# Patient Record
Sex: Male | Born: 2000 | Race: Black or African American | Hispanic: No | Marital: Single | Smoking: Never smoker
Health system: Southern US, Community
[De-identification: ages and names within clinical notes are randomized; demographics above are authoritative.]

## PROBLEM LIST (undated history)

## (undated) DIAGNOSIS — R011 Cardiac murmur, unspecified: Secondary | ICD-10-CM

---
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
## 2022-08-05 ENCOUNTER — Emergency Department (HOSPITAL_COMMUNITY): Payer: Medicaid Other

## 2022-08-05 ENCOUNTER — Encounter (HOSPITAL_COMMUNITY): Payer: Self-pay

## 2022-08-05 ENCOUNTER — Emergency Department (HOSPITAL_COMMUNITY)
Admission: EM | Admit: 2022-08-05 | Discharge: 2022-08-05 | Discharge status: Against Medical Advice | Payer: Medicaid Other | Attending: Emergency Medicine | Admitting: Emergency Medicine

## 2022-08-05 ENCOUNTER — Other Ambulatory Visit: Payer: Self-pay

## 2022-08-05 DIAGNOSIS — R079 Chest pain, unspecified: Secondary | ICD-10-CM | POA: Diagnosis present

## 2022-08-05 DIAGNOSIS — Z5321 Procedure and treatment not carried out due to patient leaving prior to being seen by health care provider: Secondary | ICD-10-CM | POA: Diagnosis not present

## 2022-08-05 DIAGNOSIS — R002 Palpitations: Secondary | ICD-10-CM | POA: Diagnosis not present

## 2022-08-05 HISTORY — DX: Cardiac murmur, unspecified: R01.1

## 2022-08-05 LAB — BASIC METABOLIC PANEL
Anion gap: 6 (ref 5–15)
BUN: 10 mg/dL (ref 6–20)
CO2: 28 mmol/L (ref 22–32)
Calcium: 9.8 mg/dL (ref 8.9–10.3)
Chloride: 107 mmol/L (ref 98–111)
Creatinine, Ser: 1.17 mg/dL (ref 0.61–1.24)
GFR, Estimated: >60 mL/min (ref >60)
Glucose, Bld: 105 mg/dL — ABNORMAL HIGH (ref 70–99)
Potassium: 4 mmol/L (ref 3.5–5.1)
Sodium: 141 mmol/L (ref 135–145)

## 2022-08-05 LAB — CBC
HCT: 45.6 % (ref 39.0–52.0)
Hemoglobin: 15.3 g/dL (ref 13.0–17.0)
MCH: 28.8 pg (ref 26.0–34.0)
MCHC: 33.6 g/dL (ref 30.0–36.0)
MCV: 85.7 fL (ref 80.0–100.0)
Platelets: 213 10*3/uL (ref 150–400)
RBC: 5.32 MIL/uL (ref 4.22–5.81)
RDW: 12.3 % (ref 11.5–15.5)
WBC: 5.2 10*3/uL (ref 4.0–10.5)
nRBC: 0 % (ref 0.0–0.2)

## 2022-08-05 LAB — TROPONIN I (HIGH SENSITIVITY): Troponin I (High Sensitivity): 3 ng/L (ref <18)

## 2022-08-05 NOTE — ED Triage Notes (Addendum)
Pt reports with chest pain and palpitations since yesterday. Pt states that it has been off and on all day.

## 2023-04-04 ENCOUNTER — Emergency Department: Payer: Medicaid Other

## 2023-04-04 ENCOUNTER — Emergency Department
Admission: EM | Admit: 2023-04-04 | Discharge: 2023-04-04 | Disposition: A | Discharge status: Home / Self Care | Payer: Medicaid Other | Attending: Emergency Medicine | Admitting: Emergency Medicine

## 2023-04-04 ENCOUNTER — Other Ambulatory Visit: Payer: Self-pay

## 2023-04-04 DIAGNOSIS — M25571 Pain in right ankle and joints of right foot: Secondary | ICD-10-CM | POA: Diagnosis present

## 2023-04-04 DIAGNOSIS — S86011A Strain of right Achilles tendon, initial encounter: Secondary | ICD-10-CM | POA: Insufficient documentation

## 2023-04-04 DIAGNOSIS — Y9367 Activity, basketball: Secondary | ICD-10-CM | POA: Insufficient documentation

## 2023-04-04 DIAGNOSIS — W228XXA Striking against or struck by other objects, initial encounter: Secondary | ICD-10-CM | POA: Diagnosis not present

## 2023-04-04 MED ORDER — MELOXICAM 15 MG PO TABS: 15.0000 mg | ORAL_TABLET | Freq: Every day | ORAL | 0 refills | Status: AC

## 2023-04-04 MED ORDER — MELOXICAM 7.5 MG PO TABS
15.0000 mg | ORAL_TABLET | Freq: Once | ORAL | Status: AC
  Administered 2023-04-04: 15 mg via ORAL
  Filled 2023-04-04: qty 2

## 2023-04-04 NOTE — ED Triage Notes (Signed)
Pt to ED from home for ankle injury. Pt was playing basketball, felt pop in the back side of his ankle. Pt is unable bear weight on his ankle. Pt is CAOx4, in no acute distress.

## 2023-04-04 NOTE — ED Provider Notes (Signed)
[facility] Provider Note  Patient Contact: 8:27 PM (approximate)   History   Foot Injury (RIGHT)   HPI  Steven Salas is a 22 y.o. male who presents emergency department complaining of right posterior ankle pain.  Patient was playing basketball and felt like something hit him in the back and ankle.  Since then he has had pain and difficulty dorsiflexion his foot.  No deformity noted.  Pain is minimal at this time.  No other injury or complaint.  Patient is never injured his Achilles in the past.     Physical Exam   Triage Vital Signs: ED Triage Vitals  Enc Vitals Group     BP 04/04/23 1931 132/72     Pulse Rate 04/04/23 1931 88     Resp 04/04/23 1931 16     Temp 04/04/23 1931 97.6 F (36.4 C)     Temp Source 04/04/23 1931 Oral     SpO2 04/04/23 1931 98 %     Weight 04/04/23 1932 215 lb (97.5 kg)     Height 04/04/23 1932 5\' 10"  (1.778 m)     Head Circumference --      Peak Flow --      Pain Score 04/04/23 1931 2     Pain Loc --      Pain Edu? --      Excl. in GC? --     Most recent vital signs: Vitals:   04/04/23 1931  BP: 132/72  Pulse: 88  Resp: 16  Temp: 97.6 F (36.4 C)  SpO2: 98%     General: Alert and in no acute distress.  Cardiovascular:  Good peripheral perfusion Respiratory: Normal respiratory effort without tachypnea or retractions. Lungs CTAB.  Musculoskeletal: Full range of motion to all extremities.  Visualization of the ankle reveals no open wounds.  No edema, ecchymosis.  Palpation of the ankle reveals intact Achilles tendon.  Patient is able to dorsiflex and plantarflex the dorsiflexion does cause pain along the Achilles.  Pulses sensation intact. Neurologic:  No gross focal neurologic deficits are appreciated.  Skin:   No rash noted Other:   ED Results / Procedures / Treatments   Labs (all labs ordered are listed, but only abnormal results are displayed) Labs Reviewed - No data to  display   EKG     RADIOLOGY  I personally viewed, evaluated, and interpreted these images as part of my medical decision making, as well as reviewing the written report by the radiologist.  ED Provider Interpretation: No osseous trauma to the ankle.  Achilles tendon appears to be intact on x-ray.  DG Ankle Complete Right  Result Date: 04/04/2023 CLINICAL DATA:  Status post trauma. EXAM: RIGHT ANKLE - COMPLETE 3+ VIEW COMPARISON:  None Available. FINDINGS: There is no evidence of fracture, dislocation, or joint effusion. There is no evidence of arthropathy or other focal bone abnormality. Soft tissue structures are unremarkable. IMPRESSION: No acute osseous abnormality. Electronically Signed   By: Aram Candela M.D.   On: 04/04/2023 20:03    PROCEDURES:  Critical Care performed: No  Procedures   MEDICATIONS ORDERED IN ED: Medications  meloxicam (MOBIC) tablet 15 mg (has no administration in time range)     IMPRESSION / MDM / ASSESSMENT AND PLAN / ED COURSE  I reviewed the triage vital signs and the nursing notes.  Differential diagnosis includes, but is not limited to, Achilles tendon rupture, ankle sprain, Achilles tendon strain, tendinitis, fracture, dislocation   Patient's presentation is most consistent with acute presentation with potential threat to life or bodily function.   Patient's diagnosis is consistent with achilles strain. Patient was playing basektball and felt pain in the achilles region. It was causing ongoing symptoms.  Palpation did not reveal any deficits concerning for acute Achilles rupture.  X-ray was obtained no fracture, dislocation and Achilles tendon was visualized on x-ray.  This time I feel that this is likely a strain of his Achilles, will place in a boot and prescribed anti-inflammatories for the patient..  Patient is given ED precautions to return to the ED for any worsening or new symptoms.     FINAL  CLINICAL IMPRESSION(S) / ED DIAGNOSES   Final diagnoses:  Strain of right Achilles tendon, initial encounter     Rx / DC Orders   ED Discharge Orders          Ordered    meloxicam (MOBIC) 15 MG tablet  Daily        04/04/23 2036             Note:  This document was prepared using Dragon voice recognition software and may include unintentional dictation errors.   Lanette Hampshire 04/04/23 2036    Chesley Noon, MD 04/07/23 [phone]

## 2023-04-11 ENCOUNTER — Other Ambulatory Visit: Payer: Self-pay | Admitting: Nurse Practitioner

## 2023-04-11 DIAGNOSIS — S86011A Strain of right Achilles tendon, initial encounter: Secondary | ICD-10-CM

## 2023-04-30 ENCOUNTER — Ambulatory Visit
Admission: RE | Admit: 2023-04-30 | Discharge: 2023-04-30 | Disposition: A | Discharge status: Home / Self Care | Payer: Medicaid Other | Source: Ambulatory Visit | Attending: Nurse Practitioner | Admitting: Nurse Practitioner

## 2023-04-30 DIAGNOSIS — S86011A Strain of right Achilles tendon, initial encounter: Secondary | ICD-10-CM | POA: Insufficient documentation

## 2023-08-26 ENCOUNTER — Ambulatory Visit: Payer: Medicaid Other | Attending: Nurse Practitioner

## 2023-08-26 ENCOUNTER — Other Ambulatory Visit: Payer: Self-pay

## 2023-08-26 DIAGNOSIS — R2689 Other abnormalities of gait and mobility: Secondary | ICD-10-CM

## 2023-08-26 DIAGNOSIS — M7661 Achilles tendinitis, right leg: Secondary | ICD-10-CM | POA: Insufficient documentation

## 2023-08-26 DIAGNOSIS — M25671 Stiffness of right ankle, not elsewhere classified: Secondary | ICD-10-CM | POA: Diagnosis present

## 2023-08-26 DIAGNOSIS — R600 Localized edema: Secondary | ICD-10-CM | POA: Diagnosis not present

## 2023-08-26 DIAGNOSIS — M6281 Muscle weakness (generalized): Secondary | ICD-10-CM

## 2023-08-26 NOTE — Therapy (Addendum)
 OUTPATIENT PHYSICAL THERAPY EVALUATION/DISCHARGE  PHYSICAL THERAPY DISCHARGE SUMMARY  Visits from Start of Care: 1  Current functional level related to goals / functional outcomes: See goals/objective   Remaining deficits: Unable to assess   Education / Equipment: HEP   Patient agrees to discharge. Patient goals were unable to assess. Patient is being discharged due to not returning since the last visit.    Patient Name: Steven Salas MRN: [id number] DOB:August 15, 2001, 22 y.o., male Today's Date: 08/27/2023  END OF SESSION:  PT End of Session - 08/26/23 1525     Visit Number 1    Number of Visits 17    Date for PT Re-Evaluation 10/21/23    Authorization Type [employer] [place] [employer]    PT Start Time 1445    PT Stop Time 1520    PT Time Calculation (min) 35 min    Activity Tolerance Patient tolerated treatment well    Behavior During Therapy WFL for tasks assessed/performed             Past Medical History:  Diagnosis Date   Heart murmur    History reviewed. No pertinent surgical history. There are no problems to display for this patient.   PCP: No PCP  REFERRING PROVIDER: Levonne Lapping, NP  REFERRING DIAG: M76.61 (ICD-10-CM) - Achilles tendinitis, right leg   Rationale for Evaluation and Treatment: Rehabilitation  THERAPY DIAG:  Stiffness of right ankle, not elsewhere classified  Muscle weakness (generalized)  Other abnormalities of gait and mobility  Localized edema  ONSET DATE: 04/04/2023  SUBJECTIVE:                                                                                                                                                                                           SUBJECTIVE STATEMENT: Pt presents to PT with reports of R ankle pain and stiffness after injury while playing basketball on 04/04/2023 that resulted in partially torn achilles. Has been in a boot for a few months and notes that pain has decreased, however his R ankle is very  stiff and weak. Podiatry recommended weaning from boot into a regular shoe with heel wedge. Also promotes numbness in lateral R ankle.  PERTINENT HISTORY:  None  PAIN:  Are you having pain?  Yes: NPRS scale: 0/10 Worst: 2/10 Pain location: R achilles Pain description: sharp, numb Aggravating factors: athletic activities Relieving factors: rest  PRECAUTIONS: None  RED FLAGS: None   WEIGHT BEARING RESTRICTIONS: No  FALLS:  Has patient fallen in last 6 months? No  LIVING ENVIRONMENT: Lives with: lives with their family Lives in: House/apartment  OCCUPATION: Arts development officer at [employer]  PLOF: Independent  PATIENT GOALS: improve R ankle ROM, get back to higher level athletic activity  OBJECTIVE:  Note: Objective measures were completed at Evaluation unless otherwise noted.  DIAGNOSTIC FINDINGS:  See imaging for MRI to R achilles  PATIENT SURVEYS:  FOTO: 47% function; 68% predicted  COGNITION: Overall cognitive status: Within functional limits for tasks assessed    SENSATION: Light touch: Impaired - lateral R ankle  MUSCLE LENGTH: Hamstrings: Right WFL Left WFL  Thomas test: Right WFL; Left WFL  POSTURE: No Significant postural limitations  PALPATION: TTP to distal R calf, R achilles  LUMBAR ROM:   AROM eval  Flexion   Extension   Right lateral flexion   Left lateral flexion   Right rotation   Left rotation    (Blank rows = not tested)  LOWER EXTREMITY ROM:   Active  Right eval Left eval  Hip flexion    Hip extension    Hip abduction    Hip adduction    Hip internal rotation    Hip external rotation    Knee flexion    Knee extension    Ankle dorsiflexion 0 A  (7 PROM) 10  (17 P)  Ankle plantarflexion    Ankle inversion    Ankle eversion     (Blank rows = not tested)  LOWER EXTREMITY MMT:    MMT Right eval Left eval  Hip flexion    Hip extension    Hip abduction    Hip adduction    Hip internal rotation    Hip external  rotation    Knee flexion    Knee extension    Ankle dorsiflexion    Ankle plantarflexion    Ankle inversion    Ankle eversion     (Blank rows = not tested)  FUNCTIONAL TESTS:  Functional Squat: Quad dominant, no deficits noted  GAIT: Distance walked: 65ft Assistive device utilized: None Level of assistance: Complete Independence Comments: decreased heel strike R  TREATMENT: [facility] Adult PT Treatment:                                                DATE: 08/26/2023 Therapeutic Exercise: Calf stretch with towel x 30" Seated heel raise x 15 R 15# Standing gastroc stretch x 30" R Standing soleus stretch x 30" R  PATIENT EDUCATION:  Education details: eval findings, FOTO, HEP, POC Person educated: Patient Education method: Explanation, Demonstration, and Handouts Education comprehension: verbalized understanding and returned demonstration  HOME EXERCISE PROGRAM: Access Code: [email or web address] URL: [email or web address] Date: 08/26/2023 Prepared by: Edwinna Areola  Exercises - Long Sitting Calf Stretch with Strap  - 1 x daily - 7 x weekly - 2-3 reps - 30 sec hold - Seated Heel Raise  - 1 x daily - 7 x weekly - 3 sets - 15 reps - 15lb hold - Gastroc Stretch on Wall  - 1 x daily - 7 x weekly - 2-3 reps - 30 sec hold - Soleus Stretch on Wall  - 1 x daily - 7 x weekly - 2-3 reps - 30 sec hold  ASSESSMENT:  CLINICAL IMPRESSION: Patient is a 22 y.o. M who was seen today for physical therapy evaluation and treatment for R achilles stair with intrasubstance tear following basketball injury on 04/04/2023. Physical findings are consistent with injury and recovery timeline as pt demonstrates decrease in R  ankle DF and strength. FOTO score shows subjective decrease in functional ability below PLOF. Pt would benefit from skilled PT services working on improving strength and range of R ankle post achilles tear.   OBJECTIVE IMPAIRMENTS: decreased activity tolerance, decreased balance,  decreased mobility, difficulty walking, decreased ROM, decreased strength, and pain  ACTIVITY LIMITATIONS: sitting, standing, squatting, stairs, transfers, and locomotion level  PARTICIPATION LIMITATIONS: driving, shopping, community activity, occupation, and yard work  PERSONAL FACTORS: Fitness and Time since onset of injury/illness/exacerbation are also affecting patient's functional outcome.   REHAB POTENTIAL: Excellent  CLINICAL DECISION MAKING: Stable/uncomplicated  EVALUATION COMPLEXITY: Low   GOALS: Goals reviewed with patient? No  SHORT TERM GOALS: Target date: 09/16/2023   Pt will be compliant and knowledgeable with initial HEP for improved comfort and carryover Baseline: initial HEP given  Goal status: INITIAL  LONG TERM GOALS: Target date: 10/21/2023   Pt will improve FOTO function score to no less than 68% as proxy for functional improvement Baseline: 47% function Goal status: INITIAL   2.  Pt will self report right heel pain no greater than 0/10 for improved comfort and functional ability Baseline: 2/10 at worst Goal status: INITIAL   3.  Pt will improve R ankle DF to no less than 10 degrees actively for improved gait and mechanics with squatting and walking Baseline: see ROM chart Goal status: INITIAL  4.  Pt will be able to perform 15 single leg heel raise on R LE for demonstration of improved calf strength and increase in functional ability Baseline: 0 Goal status: INITIAL   PLAN:  PT FREQUENCY: 2x/week  PT DURATION: 8 weeks  PLANNED INTERVENTIONS: [id number]- PT Re-evaluation, 97110-Therapeutic exercises, 97530- Therapeutic activity, [id number]- Neuromuscular re-education, 97535- Self Care, 53664- Manual therapy, [id number]- Gait training, [id number]- Aquatic Therapy, 97014- Electrical stimulation (unattended), [id number]- Electrical stimulation (manual), 97016- Vasopneumatic device, Cryotherapy, and Moist heat.  PLAN FOR NEXT SESSION: assess HEP response, progressive LE  strengthening with emphasis on calf, improving R ankle DF  For all possible CPT codes, reference the Planned Interventions line above.     Check all conditions that are expected to impact treatment: {Conditions expected to impact treatment:Musculoskeletal disorders   If treatment provided at initial evaluation, no treatment charged due to lack of authorization.       Eloy End, PT 08/27/2023, 8:33 AM

## 2023-09-01 NOTE — Addendum Note (Signed)
Addended by: Eloy End on: 09/01/2023 08:51 AM   Modules accepted: Orders

## 2023-09-04 ENCOUNTER — Encounter: Payer: Self-pay | Admitting: Physical Therapy

## 2023-09-04 ENCOUNTER — Ambulatory Visit: Payer: Medicaid Other | Admitting: Physical Therapy

## 2023-09-11 ENCOUNTER — Ambulatory Visit: Payer: Medicaid Other

## 2023-09-11 ENCOUNTER — Telehealth: Payer: Self-pay

## 2023-09-11 NOTE — Telephone Encounter (Signed)
PT called and spoke with patient who forgot this appointment. Reviewed attendance policy and because this is his second no show he can only schedule one visit at a time. Pt confirmed understanding and next appointment.   Steven Salas   09/11/23 3:07 PM

## 2023-09-15 ENCOUNTER — Ambulatory Visit: Payer: Medicaid Other | Attending: Nurse Practitioner

## 2023-09-15 ENCOUNTER — Telehealth: Payer: Self-pay

## 2023-09-15 NOTE — Telephone Encounter (Signed)
PT called but was unable to leave voicemail. This is his 3rd straight no show. He will be discharged at this time and need a new referral to come back per attendance policy.   Eloy End   09/15/23 3:23 PM

## 2024-04-15 NOTE — Progress Notes (Signed)
 New Patient Visit   Chief Complaint: Chief Complaint  Patient presents with  . Palpitations  . Follow-up  . Results   Date of Service: 04/15/2024 Date of Birth: 07/07/2001 PCP: Provider No address on file  History of Present Illness:  History of Present Illness Steven Salas is a 23 year old male with a history of palpitations and atypical chest pain who presents for a cardiovascular follow-up.  He recently underwent an echocardiogram, which showed normal heart function and no valve abnormalities. This was reassuring given his history of palpitations, atypical chest pain, and a previously noted heart murmur.  His blood pressure was noted to be elevated during the visit. He does not routinely monitor his blood pressure at home but recalls that readings at other doctor's offices are usually normal. He speculates that the recent meal he had before the appointment might have influenced the reading.   Past Medical and Surgical History  Past Medical History No past medical history on file.  Past Surgical History He has no past surgical history on file.   Medications and Allergies  Current Medications  Current Outpatient Medications on File Prior to Visit  Medication Sig Dispense Refill  . cetirizine (ZYRTEC) 10 MG tablet Take 10 mg by mouth once daily     No current facility-administered medications on file prior to visit.    Allergies: Patient has no known allergies.  Social and Family History  Social History  reports that he has never smoked. He has never used smokeless tobacco. He reports that he does not drink alcohol and does not use drugs.  Family History Family History  Problem Relation Name Age of Onset  . Obesity Mother      Review of Systems   Review of Systems  Cardiovascular:  Positive for chest pain and palpitations.      Physical Examination   Vitals:BP (!) 140/82 (BP Location: Left upper arm, Patient Position: Sitting, BP Cuff Size: Small Adult)   Pulse  70   Ht 177.8 cm (5' 10)   Wt 96.6 kg (213 lb)   SpO2 98%   BMI 30.56 kg/m  Ht:177.8 cm (5' 10) Wt:96.6 kg (213 lb) ADJ:Anib surface area is 2.18 meters squared. Body mass index is 30.56 kg/m.  Physical Exam Vitals reviewed.  HENT:     Head: Normocephalic and atraumatic.  Cardiovascular:     Rate and Rhythm: Normal rate and regular rhythm.     Pulses: Normal pulses.     Heart sounds: Normal heart sounds. No murmur heard. Pulmonary:     Effort: Pulmonary effort is normal.     Breath sounds: Normal breath sounds.  Abdominal:     General: Bowel sounds are normal.  Musculoskeletal:     Right lower leg: No edema.     Left lower leg: No edema.  Skin:    General: Skin is warm and dry.  Neurological:     Mental Status: He is alert.       Data & Results   No results for input(s): CHOLTOTAL, HDL, LDLCALC, LDLDIRECT, LDL, VLDL, TRIG in the last 73719 hours.  No results for input(s): NA, K, BUN, CREATININE, CO2, GLUCOSE, GLUF, ALT, AST, TBILI, ALB in the last 73719 hours.  No results for input(s): WBC, HGB, HCT, MCV, PLT in the last 73719 hours.  No results for input(s): INR, TSH, HGBA1C, HBA1C, PROBNP, BNP, HSTNI, DELTAHSTNI, HSTNIINTERP, TROPONINT, CK, CKMB, DDIMER in the last 73719 hours.      Assessment  23 y.o. male with  Encounter Diagnoses  Name Primary?  . Atypical chest pain Yes  . Palpitations     03/24/2024 ECHO: NORMAL LEFT VENTRICULAR SYSTOLIC FUNCTION WITH NO LVH ESTIMATED EF: >55% NORMAL LA PRESSURES WITH NORMAL DIASTOLIC FUNCTION NORMAL RIGHT VENTRICULAR SYSTOLIC FUNCTION VALVULAR REGURGITATION: No AR, No MR, No PR, TRIVIAL TR NO VALVULAR STENOSIS   Plan   No orders of the defined types were placed in this encounter.  Assessment & Plan  Palpitations, atypical chest pain, and heart murmur Echocardiogram indicates normal cardiac function with no valve abnormalities,  murmurs, or regurgitation. Cardiac status is satisfactory. - Utilize cardiology services as needed.  Hypertension Blood pressure was elevated during the visit, but he reports it is usually not elevated at other medical visits. He does not monitor blood pressure at home. The elevated reading may be due to recent eating or other transient factors. - Schedule follow-up appointment in six months to re-evaluate blood pressure.  Return in about 6 months (around 10/15/2024).   Attestation Statement:   I personally performed the service, non-incident to. (WP)   SABINA CUSTOVIC, DO    Sabina Custovic, DO

## 2024-05-24 ENCOUNTER — Ambulatory Visit: Attending: Physician Assistant | Admitting: Physical Therapy

## 2024-05-24 NOTE — Therapy (Incomplete)
 OUTPATIENT PHYSICAL THERAPY THORACOLUMBAR EVALUATION   Patient Name: Steven Salas MRN: [id number] DOB:2000-11-25, 23 y.o., male 6 Date: 05/24/2024  END OF SESSION:   Past Medical History:  Diagnosis Date   Heart murmur    No past surgical history on file. There are no active problems to display for this patient.   PCP: No PCP  REFERRING PROVIDER: Joshua Clayborne RAMAN, PA-C  REFERRING DIAG: M54.50 (ICD-10-CM) - Low back pain, unspecified  Rationale for Evaluation and Treatment: Rehabilitation  THERAPY DIAG:  No diagnosis found.  ONSET DATE: ***  SUBJECTIVE:                                                                                                                                                                                           SUBJECTIVE STATEMENT: ***  PERTINENT HISTORY:  Per MD note 04/20/24, patient reported sharp pain in back and tightness in R hip with weightbearing activities such as lifting. He believes this was due to a past injury while lifting weights. Patient reports that stretching tends to be helpful.   PAIN:  Are you having pain? {OPRCPAIN:27236}  PRECAUTIONS: None  RED FLAGS: {PT Red Flags:29287}   WEIGHT BEARING RESTRICTIONS: No  FALLS:  Has patient fallen in last 6 months? {fallsyesno:27318}  LIVING ENVIRONMENT: Lives with: {[facility] lives with:25569::lives with their family} Lives in: {Lives in:25570} Stairs: {opstairs:27293} Has following equipment at home: {Assistive devices:23999}  OCCUPATION: ***  PLOF: Independent  PATIENT GOALS: ***  NEXT MD VISIT: ***  OBJECTIVE:  Note: Objective measures were completed at Evaluation unless otherwise noted.  DIAGNOSTIC FINDINGS:  ***  PATIENT SURVEYS:  Modified Oswestry:  MODIFIED OSWESTRY DISABILITY SCALE  Date: *** Score  Pain intensity {ODI 1:32962}  2. Personal care (washing, dressing, etc.) {ODI 2:32963}  3. Lifting {ODI 3:32964}  4. Walking {ODI 4:32965}  5. Sitting  {ODI 5:32966}  6. Standing {ODI 6:32967}  7. Sleeping {ODI 7:32968}  8. Social Life {ODI 8:32969}  9. Traveling {ODI 9:32970}  10. Employment/ Homemaking {ODI 10:32971}  Total ***/50   Interpretation of scores: Score Category Description  0-20% Minimal Disability The patient can cope with most living activities. Usually no treatment is indicated apart from advice on lifting, sitting and exercise  21-40% Moderate Disability The patient experiences more pain and difficulty with sitting, lifting and standing. Travel and social life are more difficult and they may be disabled from work. Personal care, sexual activity and sleeping are not grossly affected, and the patient can usually be managed by conservative means  41-60% Severe Disability Pain remains the main problem in this group, but activities of daily living are affected.  These patients require a detailed investigation  61-80% Crippled Back pain impinges on all aspects of the patient's life. Positive intervention is required  81-100% Bed-bound  These patients are either bed-bound or exaggerating their symptoms  Bluford FORBES Zoe DELENA Karon DELENA, et al. Surgery versus conservative management of stable thoracolumbar fracture: the PRESTO feasibility RCT. [place] ([place]): [employer]; 2021 Nov. [facility] Technology Assessment, No. 25.62.) Appendix 3, Oswestry Disability Index category descriptors. Available from: FindJewelers.cz  Minimally Clinically Important Difference (MCID) = 12.8%  COGNITION: Overall cognitive status: {cognition:24006}     SENSATION: {sensation:27233}  MUSCLE LENGTH: Hamstrings: Right *** deg; Left *** deg Debby test: Right *** deg; Left *** deg  POSTURE: {posture:25561}  PALPATION: ***  LUMBAR ROM:   AROM eval  Flexion   Extension   Right lateral flexion   Left lateral flexion   Right rotation   Left rotation    (Blank rows = not tested)  LOWER EXTREMITY ROM:      {AROM/PROM:27142}  Right eval Left eval  Hip flexion    Hip extension    Hip abduction    Hip adduction    Hip internal rotation    Hip external rotation    Knee flexion    Knee extension    Ankle dorsiflexion    Ankle plantarflexion    Ankle inversion    Ankle eversion     (Blank rows = not tested)  LOWER EXTREMITY MMT:    MMT Right eval Left eval  Hip flexion    Hip extension    Hip abduction    Hip adduction    Hip internal rotation    Hip external rotation    Knee flexion    Knee extension    Ankle dorsiflexion    Ankle plantarflexion    Ankle inversion    Ankle eversion     (Blank rows = not tested)  LUMBAR SPECIAL TESTS:  {lumbar special test:25242}  FUNCTIONAL TESTS:  {Functional tests:24029}  GAIT: Distance walked: *** Assistive device utilized: {Assistive devices:23999} Level of assistance: {Levels of assistance:24026} Comments: ***  TREATMENT DATE: 05/24/24                                                                                                                               PATIENT EDUCATION:  Education details: *** Person educated: {Person educated:25204} Education method: {Education Method:25205} Education comprehension: {Education Comprehension:25206}  HOME EXERCISE PROGRAM: ***  ASSESSMENT:  CLINICAL IMPRESSION: Patient is a 23 y.o. male who was seen today for physical therapy evaluation and treatment for ***.   OBJECTIVE IMPAIRMENTS: {opptimpairments:25111}.   ACTIVITY LIMITATIONS: {activitylimitations:27494}  PARTICIPATION LIMITATIONS: {participationrestrictions:25113}  PERSONAL FACTORS: {Personal factors:25162} are also affecting patient's functional outcome.   REHAB POTENTIAL: {rehabpotential:25112}  CLINICAL DECISION MAKING: {clinical decision making:25114}  EVALUATION COMPLEXITY: {Evaluation complexity:25115}   GOALS: Goals reviewed with patient? Yes  SHORT TERM GOALS: Target date:  06/14/2024  *** Baseline: Goal status: INITIAL  2.  ***  Baseline:  Goal status: INITIAL  3.  *** Baseline:  Goal status: INITIAL  4.  *** Baseline:  Goal status: INITIAL  5.  *** Baseline:  Goal status: INITIAL  6.  *** Baseline:  Goal status: INITIAL  LONG TERM GOALS: Target date: 07/05/2024   *** Baseline:  Goal status: INITIAL  2.  *** Baseline:  Goal status: INITIAL  3.  *** Baseline:  Goal status: INITIAL  4.  *** Baseline:  Goal status: INITIAL  5.  *** Baseline:  Goal status: INITIAL  6.  *** Baseline:  Goal status: INITIAL  PLAN:  PT FREQUENCY: 1-2x/week  PT DURATION: 6 weeks  PLANNED INTERVENTIONS: {rehab planned interventions:25118::97110-Therapeutic exercises,97530- Therapeutic [phone]- Neuromuscular re-education,97535- Self Rjmz,02859- Manual therapy}.  PLAN FOR NEXT SESSION: ***   Maryanne Finder, PT, DPT Physical Therapist - [place]  [facility] 05/24/2024, 7:57 AM

## 2024-05-26 ENCOUNTER — Ambulatory Visit: Admitting: Physical Therapy

## 2024-05-26 NOTE — Therapy (Incomplete)
 OUTPATIENT PHYSICAL THERAPY THORACOLUMBAR EVALUATION   Patient Name: Steven Salas MRN: [id number] DOB:[birth year], 23 y.o., male 23 Date: 05/26/2024  END OF SESSION:   Past Medical History:  Diagnosis Date   Heart murmur    No past surgical history on file. There are no active problems to display for this patient.   PCP: None listed   REFERRING PROVIDER: Joshua Clayborne RAMAN, PA-C   REFERRING DIAG:  Diagnosis  M54.50 (ICD-10-CM) - Low back pain, unspecified    Rationale for Evaluation and Treatment: Rehabilitation  THERAPY DIAG:  No diagnosis found.  ONSET DATE: ***  SUBJECTIVE:                                                                                                                                                                                           SUBJECTIVE STATEMENT: ***  PERTINENT HISTORY:  Patient is a 23 year old male presenting with intermittent back pain and right hip tightness. Patient reports that despite engaging in regular workouts and strengthening exercises, he continues to experience discomfort, suggesting a possible muscle imbalance. The pain is described as sharp and is exacerbated by weight-bearing activities and lifting. He notes that the pain subsides when he ceases these activities and has never persisted for more than a week. Patient recalls a specific incident where he believes he may have injured his back while lifting weights, describing a sensation that felt like a pinched nerve. He has not sought a formal diagnosis for this incident. He has not taken any medications such as ibuprofen, Motrin, or aspirin to alleviate the pain. Patient has not undergone physical therapy for his back or hip, but he has received physical therapy for his kidneys in the past. He reports that stretching exercises have provided some relief for his symptoms.  Clayborne Joshua, [place]  04/20/2024   PAIN:  Are you having pain? {OPRCPAIN:27236}  PRECAUTIONS: {Therapy  precautions:24002}  RED FLAGS: {PT Red Flags:29287}   WEIGHT BEARING RESTRICTIONS: {Yes ***/No:24003}  FALLS:  Has patient fallen in last 6 months? {fallsyesno:27318}  LIVING ENVIRONMENT: Lives with: {OPRC lives with:25569::lives with their family} Lives in: {Lives in:25570} Stairs: {opstairs:27293} Has following equipment at home: {Assistive devices:23999}  OCCUPATION: ***  PLOF: {PLOF:24004}  PATIENT GOALS: ***  NEXT MD VISIT: ***  OBJECTIVE:  Note: Objective measures were completed at Evaluation unless otherwise noted.  DIAGNOSTIC FINDINGS:  N/A  PATIENT SURVEYS:  MODI:  LEFS:   COGNITION: Overall cognitive status: {cognition:24006}     SENSATION: {sensation:27233}  MUSCLE LENGTH: Hamstrings: Right *** deg; Left *** deg Thomas test: Right *** deg; Left *** deg  POSTURE: {posture:25561}  PALPATION: ***  LUMBAR  ROM:   AROM eval  Flexion   Extension   Right lateral flexion   Left lateral flexion   Right rotation   Left rotation    (Blank rows = not tested)  LOWER EXTREMITY ROM:     Active  Right eval Left eval  Hip flexion    Hip extension    Hip abduction    Hip adduction    Hip internal rotation    Hip external rotation    Knee flexion    Knee extension    Ankle dorsiflexion    Ankle plantarflexion    Ankle inversion    Ankle eversion     (Blank rows = not tested)  LOWER EXTREMITY MMT:    MMT Right eval Left eval  Hip flexion    Hip extension    Hip abduction    Hip adduction    Hip internal rotation    Hip external rotation    Knee flexion    Knee extension    Ankle dorsiflexion    Ankle plantarflexion    Ankle inversion    Ankle eversion     (Blank rows = not tested)  LUMBAR SPECIAL TESTS:  {lumbar special test:25242}  FUNCTIONAL TESTS:  {Functional tests:24029}   5xSTS:    : deferred to future appointment due to limited space  GAIT: Distance walked: *** Assistive device utilized: {Assistive  devices:23999} Level of assistance: {Levels of assistance:24026} Comments: ***  TREATMENT DATE: 05/26/2024                                                                                                                               See eval/ HEP    PATIENT EDUCATION:  Education details: *** Person educated: {Person educated:25204} Education method: {Education Method:25205} Education comprehension: {Education Comprehension:25206}  HOME EXERCISE PROGRAM: ***  ASSESSMENT:  CLINICAL IMPRESSION: Patient is a 23 y.o. M who was seen today for physical therapy evaluation and treatment for LBP.   OBJECTIVE IMPAIRMENTS: {opptimpairments:25111}.   ACTIVITY LIMITATIONS: {activitylimitations:27494}  PARTICIPATION LIMITATIONS: {participationrestrictions:25113}  PERSONAL FACTORS: {Personal factors:25162} are also affecting patient's functional outcome.   REHAB POTENTIAL: {rehabpotential:25112}  CLINICAL DECISION MAKING: {clinical decision making:25114}  EVALUATION COMPLEXITY: {Evaluation complexity:25115}   GOALS: Goals reviewed with patient? Yes  SHORT TERM GOALS: Target date: ***  *** Baseline: Goal status: INITIAL  2.  *** Baseline:  Goal status: INITIAL  3.  *** Baseline:  Goal status: INITIAL  4.  *** Baseline:  Goal status: INITIAL  5.  *** Baseline:  Goal status: INITIAL  6.  *** Baseline:  Goal status: INITIAL  LONG TERM GOALS: Target date: ***  *** Baseline:  Goal status: INITIAL  2.  *** Baseline:  Goal status: INITIAL  3.  *** Baseline:  Goal status: INITIAL  4.  *** Baseline:  Goal status: INITIAL  5.  *** Baseline:  Goal status: INITIAL  6.  *** Baseline:  Goal status: INITIAL  PLAN:  PT FREQUENCY: {rehab frequency:25116}  PT DURATION: {rehab duration:25117}  PLANNED INTERVENTIONS: {rehab planned interventions:25118::97110-Therapeutic exercises,97530- Therapeutic [phone]- Neuromuscular re-education,97535-  Self Rjmz,02859- Manual therapy}.  PLAN FOR NEXT SESSION: ***   Anuhea Gassner, Student-PT 05/26/2024, 8:54 AM

## 2024-06-01 ENCOUNTER — Ambulatory Visit: Admitting: Physical Therapy

## 2024-06-03 ENCOUNTER — Encounter: Admitting: Physical Therapy

## 2024-06-08 ENCOUNTER — Encounter: Admitting: Physical Therapy

## 2024-06-10 ENCOUNTER — Encounter: Admitting: Physical Therapy

## 2024-06-15 ENCOUNTER — Encounter: Admitting: Physical Therapy

## 2024-06-17 ENCOUNTER — Encounter: Admitting: Physical Therapy

## 2024-06-22 ENCOUNTER — Encounter: Admitting: Physical Therapy

## 2024-06-24 ENCOUNTER — Encounter: Admitting: Physical Therapy

## 2024-06-29 ENCOUNTER — Encounter: Admitting: Physical Therapy

## 2024-07-01 ENCOUNTER — Encounter: Admitting: Physical Therapy

## 2024-07-06 ENCOUNTER — Encounter: Admitting: Physical Therapy

## 2024-07-08 ENCOUNTER — Encounter: Admitting: Physical Therapy

## 2024-07-13 ENCOUNTER — Encounter: Admitting: Physical Therapy

## 2024-07-15 ENCOUNTER — Encounter: Admitting: Physical Therapy

## 2024-08-03 ENCOUNTER — Ambulatory Visit: Attending: Physician Assistant | Admitting: Occupational Therapy

## 2024-08-03 NOTE — Therapy (Deleted)
 OUTPATIENT OCCUPATIONAL THERAPY ORTHO EVALUATION  Patient Name: Steven Salas MRN: [id number] DOB:27-Jan-2001, 23 y.o., male 53 Date: 08/03/2024  PCP: PCP no  REFERRING PROVIDER: Joshua PA  END OF SESSION:   Past Medical History:  Diagnosis Date   Heart murmur    No past surgical history on file. There are no active problems to display for this patient.   ONSET DATE: ***  REFERRING DIAG: ***  THERAPY DIAG:  No diagnosis found.  Rationale for Evaluation and Treatment: Rehabilitation  SUBJECTIVE:   SUBJECTIVE STATEMENT: *** Pt accompanied by: {accompnied:27141}  PERTINENT HISTORY:  07/13/24 Joshua PA visit note: 07/13/2024 [id number] We discussed your vitamin D prescription: - I have sent your vitamin D prescription with three refills to your new pharmacy in [place]. Please contact your pharmacy to request the refill. - If your new pharmacy needs to transfer the prescription from your old pharmacy, they can handle the transfer directly.  We discussed your wrist pain: - You reported wrist pain that began after a workout injury a few months ago. The pain has improved but persists with certain movements, and you are unable to lift more than 20-30 pounds. - I am referring you to physical therapy to help restore function and reduce pain. - Your physical therapy referral has been sent to [facility] on [facility] in [place]. Their office will contact you to schedule an appointment.    PRECAUTIONS: None    WEIGHT BEARING RESTRICTIONS: No  PAIN:  Are you having pain? {OPRCPAIN:27236}  FALLS: Has patient fallen in last 6 months? No  LIVING ENVIRONMENT: Lives with: {OPRC lives with:25569::lives with their family}    PLOF: {PLOF:24004}  PATIENT GOALS: ***  NEXT MD VISIT: ***  OBJECTIVE:  Note: Objective measures were completed at Evaluation unless otherwise noted.  HAND DOMINANCE: Right  ADLs: {ADLs OT:31716}  FUNCTIONAL OUTCOME  MEASURES: {OTFUNCTIONALMEASURES:27238}  UPPER EXTREMITY ROM:     Active ROM Right eval Left eval  Shoulder flexion    Shoulder abduction    Shoulder adduction    Shoulder extension    Shoulder internal rotation    Shoulder external rotation    Elbow flexion    Elbow extension    Wrist flexion    Wrist extension    Wrist ulnar deviation    Wrist radial deviation    Wrist pronation    Wrist supination    (Blank rows = not tested)  Active ROM Right eval Left eval  Thumb MCP (0-60)    Thumb IP (0-80)    Thumb Radial abd/add (0-55)     Thumb Palmar abd/add (0-45)     Thumb Opposition to Small Finger     Index MCP (0-90)     Index PIP (0-100)     Index DIP (0-70)      Long MCP (0-90)      Long PIP (0-100)      Long DIP (0-70)      Ring MCP (0-90)      Ring PIP (0-100)      Ring DIP (0-70)      Little MCP (0-90)      Little PIP (0-100)      Little DIP (0-70)      (Blank rows = not tested)   UPPER EXTREMITY MMT:     MMT Right eval Left eval  Shoulder flexion    Shoulder abduction    Shoulder adduction    Shoulder extension    Shoulder internal rotation  Shoulder external rotation    Middle trapezius    Lower trapezius    Elbow flexion    Elbow extension    Wrist flexion    Wrist extension    Wrist ulnar deviation    Wrist radial deviation    Wrist pronation    Wrist supination    (Blank rows = not tested)  HAND FUNCTION: Grip strength: Right: *** lbs; Left: *** lbs, Lateral pinch: Right: *** lbs, Left: *** lbs, and 3 point pinch: Right: *** lbs, Left: *** lbs  COORDINATION: {otcoordination:27237}  SENSATION: {sensation:27233}  EDEMA: ***  COGNITION: Overall cognitive status: Within functional limits for tasks assessed   OBSERVATIONS: ***   TREATMENT DATE: 08/03/24                                                                                                                             Modalities: {OPRCMODALITIES:31717}     PATIENT EDUCATION: Education details: findings of eval and HEP  Person educated: Patient Education method: Explanation, Demonstration, Tactile cues, Verbal cues, and Handouts Education comprehension: verbalized understanding, returned demonstration, verbal cues required, and needs further education    GOALS: Goals reviewed with patient? Yes   LONG TERM GOALS: Target date: ***  *** Baseline:  Goal status: INITIAL  2.  *** Baseline:  Goal status: INITIAL  3.  *** Baseline:  Goal status: INITIAL  4.  *** Baseline:  Goal status: INITIAL  5.  *** Baseline:  Goal status: INITIAL  6.  *** Baseline:  Goal status: INITIAL  ASSESSMENT:  CLINICAL IMPRESSION: Patient seen today for occupational therapy evaluation for ***.   PERFORMANCE DEFICITS: in functional skills including ADLs, IADLs, ROM, strength, pain, flexibility, decreased knowledge of use of DME, and UE functional use,   and psychosocial skills including environmental adaptation and routines and behaviors.   IMPAIRMENTS: are limiting patient from ADLs, IADLs, rest and sleep, play, leisure, and social participation.   COMORBIDITIES: has no other co-morbidities that affects occupational performance. Patient will benefit from skilled OT to address above impairments and improve overall function.  MODIFICATION OR ASSISTANCE TO COMPLETE EVALUATION: No modification of tasks or assist necessary to complete an evaluation.  OT OCCUPATIONAL PROFILE AND HISTORY: Problem focused assessment: Including review of records relating to presenting problem.  CLINICAL DECISION MAKING: LOW - limited treatment options, no task modification necessary  REHAB POTENTIAL: Good for goals  EVALUATION COMPLEXITY: Low     PLAN:  OT FREQUENCY: 1x/week  OT DURATION: 6 weeks  PLANNED INTERVENTIONS: 97168 OT Re-evaluation, 97535 self care/ADL training, 02889 therapeutic exercise, 97530  therapeutic activity, 97112 neuromuscular re-education, 97140 manual therapy, 97035 ultrasound, 97018 paraffin, 02960 fluidotherapy, 97034 contrast bath, 97033 iontophoresis, passive range of motion, patient/family education, and DME and/or AE instructions  CONSULTED AND AGREED WITH PLAN OF CARE: Patient     Ancel Peters, OTR/L,CLT 08/03/2024, 2:14 PM

## 2024-08-05 ENCOUNTER — Ambulatory Visit: Admitting: Occupational Therapy
